# Patient Record
Sex: Female | Born: 1935
Health system: Southern US, Community
[De-identification: ages and names within clinical notes are randomized; demographics above are authoritative.]

## PROBLEM LIST (undated history)

## (undated) DIAGNOSIS — M810 Age-related osteoporosis without current pathological fracture: Secondary | ICD-10-CM

## (undated) DIAGNOSIS — F0390 Unspecified dementia without behavioral disturbance: Secondary | ICD-10-CM

## (undated) DIAGNOSIS — I10 Essential (primary) hypertension: Secondary | ICD-10-CM

## (undated) DIAGNOSIS — C801 Malignant (primary) neoplasm, unspecified: Secondary | ICD-10-CM

## (undated) DIAGNOSIS — I872 Venous insufficiency (chronic) (peripheral): Secondary | ICD-10-CM

---

## 2014-01-22 DIAGNOSIS — M79675 Pain in left toe(s): Secondary | ICD-10-CM | POA: Diagnosis not present

## 2014-01-22 DIAGNOSIS — M79674 Pain in right toe(s): Secondary | ICD-10-CM | POA: Diagnosis not present

## 2014-01-22 DIAGNOSIS — B351 Tinea unguium: Secondary | ICD-10-CM | POA: Diagnosis not present

## 2014-09-24 ENCOUNTER — Ambulatory Visit: Payer: Self-pay | Admitting: Podiatry

## 2015-02-18 DIAGNOSIS — Z23 Encounter for immunization: Secondary | ICD-10-CM | POA: Diagnosis not present

## 2015-09-06 DIAGNOSIS — N390 Urinary tract infection, site not specified: Secondary | ICD-10-CM | POA: Diagnosis not present

## 2015-12-08 DIAGNOSIS — L97509 Non-pressure chronic ulcer of other part of unspecified foot with unspecified severity: Secondary | ICD-10-CM | POA: Diagnosis not present

## 2015-12-08 DIAGNOSIS — L84 Corns and callosities: Secondary | ICD-10-CM | POA: Diagnosis not present

## 2015-12-08 DIAGNOSIS — B351 Tinea unguium: Secondary | ICD-10-CM | POA: Diagnosis not present

## 2016-01-20 DIAGNOSIS — Z23 Encounter for immunization: Secondary | ICD-10-CM | POA: Diagnosis not present

## 2016-07-03 DIAGNOSIS — D045 Carcinoma in situ of skin of trunk: Secondary | ICD-10-CM | POA: Diagnosis not present

## 2016-07-03 DIAGNOSIS — C50911 Malignant neoplasm of unspecified site of right female breast: Secondary | ICD-10-CM | POA: Diagnosis not present

## 2017-11-16 ENCOUNTER — Emergency Department (HOSPITAL_COMMUNITY): Payer: Medicare PPO

## 2017-11-16 ENCOUNTER — Other Ambulatory Visit: Payer: Self-pay

## 2017-11-16 ENCOUNTER — Emergency Department (HOSPITAL_COMMUNITY)
Admission: EM | Admit: 2017-11-16 | Discharge: 2017-11-16 | Disposition: A | Discharge status: Home / Self Care | Payer: Medicare PPO | Attending: Emergency Medicine | Admitting: Emergency Medicine

## 2017-11-16 ENCOUNTER — Encounter (HOSPITAL_COMMUNITY): Payer: Self-pay

## 2017-11-16 DIAGNOSIS — F0390 Unspecified dementia without behavioral disturbance: Secondary | ICD-10-CM | POA: Diagnosis not present

## 2017-11-16 DIAGNOSIS — S0990XA Unspecified injury of head, initial encounter: Secondary | ICD-10-CM | POA: Diagnosis not present

## 2017-11-16 DIAGNOSIS — I872 Venous insufficiency (chronic) (peripheral): Secondary | ICD-10-CM | POA: Diagnosis not present

## 2017-11-16 DIAGNOSIS — W19XXXA Unspecified fall, initial encounter: Secondary | ICD-10-CM | POA: Diagnosis not present

## 2017-11-16 DIAGNOSIS — M25521 Pain in right elbow: Secondary | ICD-10-CM | POA: Diagnosis not present

## 2017-11-16 DIAGNOSIS — M79604 Pain in right leg: Secondary | ICD-10-CM | POA: Insufficient documentation

## 2017-11-16 DIAGNOSIS — R410 Disorientation, unspecified: Secondary | ICD-10-CM | POA: Insufficient documentation

## 2017-11-16 DIAGNOSIS — R404 Transient alteration of awareness: Secondary | ICD-10-CM | POA: Diagnosis not present

## 2017-11-16 DIAGNOSIS — R51 Headache: Secondary | ICD-10-CM | POA: Diagnosis not present

## 2017-11-16 DIAGNOSIS — M542 Cervicalgia: Secondary | ICD-10-CM | POA: Diagnosis not present

## 2017-11-16 DIAGNOSIS — I10 Essential (primary) hypertension: Secondary | ICD-10-CM | POA: Insufficient documentation

## 2017-11-16 DIAGNOSIS — S199XXA Unspecified injury of neck, initial encounter: Secondary | ICD-10-CM | POA: Diagnosis not present

## 2017-11-16 HISTORY — DX: Venous insufficiency (chronic) (peripheral): I87.2

## 2017-11-16 HISTORY — DX: Age-related osteoporosis without current pathological fracture: M81.0

## 2017-11-16 HISTORY — DX: Essential (primary) hypertension: I10

## 2017-11-16 HISTORY — DX: Unspecified dementia, unspecified severity, without behavioral disturbance, psychotic disturbance, mood disturbance, and anxiety: F03.90

## 2017-11-16 HISTORY — DX: Malignant (primary) neoplasm, unspecified: C80.1

## 2017-11-16 NOTE — ED Provider Notes (Signed)
[facility] DEPT Provider Note   CSN: [id number] Arrival date & time: 11/16/17  1458     History   Chief Complaint Chief Complaint  Patient presents with  . Fall    HPI Nina Hall is a 84 y.o. female.  She is brought in by EMS from a fall [place] her [place] [facility] [place].  Reportedly patient was walking in the hallway tripped and fell onto her right side.  Per EMS she had some tenderness to her right elbow and right leg.  When I speak to the patient here in the emergency department she does not recall a fall and denies any complaints.  Level 5 caveat secondary to dementia.  The history is provided by the patient and the EMS personnel.  Fall  This is a new problem. The current episode started 1 to 2 hours ago. The problem has been resolved. Pertinent negatives include no chest pain, no abdominal pain, no headaches and no shortness of breath. Nothing aggravates the symptoms. Nothing relieves the symptoms. She has tried rest for the symptoms. The treatment provided moderate relief.    Past Medical History:  Diagnosis Date  . Cancer [facility])    malignant neoplasm of breast  . Dementia ([place])   . Hypertension   . Osteoporosis   . Peripheral venous insufficiency     There are no active problems to display for this patient.   History reviewed. No pertinent surgical history.   OB History   None      Home Medications    Prior to Admission medications   Not on File    Family History No family history on file.  Social History Social History   Tobacco Use  . Smoking status: Not on file  Substance Use Topics  . Alcohol use: Not on file  . Drug use: Not on file     Allergies   Patient has no known allergies.   Review of Systems Review of Systems  Constitutional: Negative for fever.  HENT: Negative for sore throat.   Eyes: Negative for visual disturbance.  Respiratory: Negative for shortness of breath.   Cardiovascular:  Negative for chest pain.  Gastrointestinal: Negative for abdominal pain.  Genitourinary: Negative for dysuria.  Musculoskeletal: Negative for neck pain.  Skin: Negative for rash.  Neurological: Negative for headaches.     Physical Exam Updated Vital Signs BP (!) 151/97 (BP Location: Right Arm)   Pulse 88   Temp 98 F (36.7 C) (Oral)   Resp 18   SpO2 95%   Physical Exam  Constitutional: She appears well-developed and well-nourished. No distress.  HENT:  Head: Normocephalic and atraumatic.  Eyes: Conjunctivae are normal.  Neck: Neck supple.  Cardiovascular: Normal rate, regular rhythm, normal heart sounds and intact distal pulses.  No murmur heard. Pulmonary/Chest: Effort normal and breath sounds normal. No respiratory distress.  Abdominal: Soft. There is no tenderness.  Musculoskeletal: Normal range of motion. She exhibits no edema, tenderness or deformity.  Neurological: She is alert. She has normal strength. She is disoriented (time and place). No cranial nerve deficit or sensory deficit. GCS eye subscore is 4. GCS verbal subscore is 5. GCS motor subscore is 6.  Skin: Skin is warm and dry.  Psychiatric: She has a normal mood and affect.  Nursing note and vitals reviewed.    ED Treatments / Results  Labs (all labs ordered are listed, but only abnormal results are displayed) Labs Reviewed - No data to display  EKG None  Radiology Ct Head Wo Contrast  Result Date: 11/16/2017 CLINICAL DATA:  Headache and neck pain post fall, history breast cancer, dementia, hypertension EXAM: CT HEAD WITHOUT CONTRAST CT CERVICAL SPINE WITHOUT CONTRAST TECHNIQUE: Multidetector CT imaging of the head and cervical spine was performed following the standard protocol without intravenous contrast. Multiplanar CT image reconstructions of the cervical spine were also generated. COMPARISON:  None FINDINGS: CT HEAD FINDINGS Brain: Generalized atrophy. Normal ventricular morphology. No midline shift  or mass effect. Small vessel chronic ischemic changes of deep cerebral white matter. Old superior LEFT cerebellar infarct. No intracranial hemorrhage, mass lesion, evidence of acute infarction, or extra-axial fluid collection. Vascular: No hyperdense vessels. Skull: Bones appear demineralized but intact. Sinuses/Orbits: Minimal mucosal thickening in ethmoid air cells. Remaining visualized paranasal sinuses and mastoid air cells clear. Intraorbital tissue planes clear. Other: N/A CT CERVICAL SPINE FINDINGS Alignment: Normal Skull base and vertebrae: Diffuse osseous demineralization. Multilevel facet degenerative changes. Partial fusion of C3-C4. Visualized skull base intact. Cervical vertebra normal in height. No fracture, subluxation, or bone destruction. Soft tissues and spinal canal: Prevertebral soft tissues normal thickness. Cervical soft tissues otherwise unremarkable. Disc levels:  No additional abnormalities Upper chest: Lung apices clear Other: N/A IMPRESSION: Atrophy with small vessel chronic ischemic changes of deep cerebral white matter. Old LEFT cerebellar infarct. No acute intracranial abnormalities. Osseous demineralization with degenerative disc and facet disease changes of the cervical spine. No acute cervical spine abnormalities. Electronically Signed   By: Lavonia Dana M.D.   On: 11/16/2017 16:37   Ct Cervical Spine Wo Contrast  Result Date: 11/16/2017 CLINICAL DATA:  Headache and neck pain post fall, history breast cancer, dementia, hypertension EXAM: CT HEAD WITHOUT CONTRAST CT CERVICAL SPINE WITHOUT CONTRAST TECHNIQUE: Multidetector CT imaging of the head and cervical spine was performed following the standard protocol without intravenous contrast. Multiplanar CT image reconstructions of the cervical spine were also generated. COMPARISON:  None FINDINGS: CT HEAD FINDINGS Brain: Generalized atrophy. Normal ventricular morphology. No midline shift or mass effect. Small vessel chronic ischemic  changes of deep cerebral white matter. Old superior LEFT cerebellar infarct. No intracranial hemorrhage, mass lesion, evidence of acute infarction, or extra-axial fluid collection. Vascular: No hyperdense vessels. Skull: Bones appear demineralized but intact. Sinuses/Orbits: Minimal mucosal thickening in ethmoid air cells. Remaining visualized paranasal sinuses and mastoid air cells clear. Intraorbital tissue planes clear. Other: N/A CT CERVICAL SPINE FINDINGS Alignment: Normal Skull base and vertebrae: Diffuse osseous demineralization. Multilevel facet degenerative changes. Partial fusion of C3-C4. Visualized skull base intact. Cervical vertebra normal in height. No fracture, subluxation, or bone destruction. Soft tissues and spinal canal: Prevertebral soft tissues normal thickness. Cervical soft tissues otherwise unremarkable. Disc levels:  No additional abnormalities Upper chest: Lung apices clear Other: N/A IMPRESSION: Atrophy with small vessel chronic ischemic changes of deep cerebral white matter. Old LEFT cerebellar infarct. No acute intracranial abnormalities. Osseous demineralization with degenerative disc and facet disease changes of the cervical spine. No acute cervical spine abnormalities. Electronically Signed   By: Lavonia Dana M.D.   On: 11/16/2017 16:37    Procedures Procedures (including critical care time)  Medications Ordered in ED Medications - No data to display   Initial Impression / Assessment and Plan / ED Course  I have reviewed the triage vital signs and the nursing notes.  Pertinent labs & imaging results that were available during my care of the patient were reviewed by me and considered in my medical decision making (see chart for details).  Clinical Course as of Nov 17 2322  Fri Nov 17, 7370  3856 84 year old female not on anticoagulation here after what sounds like a mechanical fall at her living facility.  She denies any complaints but also does not recall the fall.   She has no obvious findings on physical exam.  I have ordered her to get a head CT and C-spine CT.  Awaiting family who are supposedly on the way here.   [MB]  9767 Patient's power of attorney her niece is here now reviewed the physical findings and results of her CAT scan imaging with her and the patient.  We will try to get the patient up and make sure that she is able to ambulate safely.   [MB]  1806 The patient has been up and ambulated here with assistance.  Her niece is comfortable with her going back home.   [MB]    Clinical Course User Index [MB] Hayden Rasmussen, MD    Final Clinical Impressions(s) / ED Diagnoses   Final diagnoses:  Fall, initial encounter    ED Discharge Orders    None       Hayden Rasmussen, MD 11/16/17 2326

## 2017-11-16 NOTE — ED Notes (Signed)
Discharge instructions reviewed with patient. Patient verbalizes understanding. VSS.   

## 2017-11-16 NOTE — Discharge Instructions (Addendum)
You were evaluated in the emergency department for a fall.  We did not find any obvious signs of injury.  You had a CAT scan of your head and cervical spine that was unremarkable.  Please follow-up with your doctor and return if any concerns.

## 2017-11-16 NOTE — ED Triage Notes (Addendum)
Patient BIB EMS from [facility]. Patient was walking down the hallway, tripped and fell on right side. Patient reports tenderness to right elbow and right leg. No deformities. No bloodthinners. VSS. Hx of dementia. Per EMS report, CNA on scene would call patient's family to inform them of the fall.  Patient is DNR- golden ticket at bedside. BP 138/70, HR 90, RR16, CBG 154

## 2017-11-16 NOTE — ED Notes (Signed)
Patient ambulated to the door and back in the room with minimal assist. Patient denies pain.

## 2017-11-16 NOTE — ED Notes (Signed)
Bed: EX93 Expected date:  Expected time:  Means of arrival:  Comments: EMS- 84 yo female, fall

## 2018-02-15 DIAGNOSIS — Q845 Enlarged and hypertrophic nails: Secondary | ICD-10-CM | POA: Diagnosis not present

## 2018-02-15 DIAGNOSIS — I739 Peripheral vascular disease, unspecified: Secondary | ICD-10-CM | POA: Diagnosis not present

## 2018-02-15 DIAGNOSIS — B351 Tinea unguium: Secondary | ICD-10-CM | POA: Diagnosis not present

## 2018-02-15 DIAGNOSIS — L603 Nail dystrophy: Secondary | ICD-10-CM | POA: Diagnosis not present

## 2018-05-30 DIAGNOSIS — R2689 Other abnormalities of gait and mobility: Secondary | ICD-10-CM | POA: Diagnosis not present

## 2018-05-30 DIAGNOSIS — M6281 Muscle weakness (generalized): Secondary | ICD-10-CM | POA: Diagnosis not present

## 2018-06-03 DIAGNOSIS — M6281 Muscle weakness (generalized): Secondary | ICD-10-CM | POA: Diagnosis not present

## 2018-06-03 DIAGNOSIS — R2689 Other abnormalities of gait and mobility: Secondary | ICD-10-CM | POA: Diagnosis not present

## 2018-06-06 DIAGNOSIS — Z20828 Contact with and (suspected) exposure to other viral communicable diseases: Secondary | ICD-10-CM | POA: Diagnosis not present

## 2018-06-11 DIAGNOSIS — Z20828 Contact with and (suspected) exposure to other viral communicable diseases: Secondary | ICD-10-CM | POA: Diagnosis not present

## 2018-07-11 DIAGNOSIS — B351 Tinea unguium: Secondary | ICD-10-CM | POA: Diagnosis not present

## 2018-07-11 DIAGNOSIS — I739 Peripheral vascular disease, unspecified: Secondary | ICD-10-CM | POA: Diagnosis not present

## 2018-07-11 DIAGNOSIS — L603 Nail dystrophy: Secondary | ICD-10-CM | POA: Diagnosis not present

## 2018-07-11 DIAGNOSIS — Q845 Enlarged and hypertrophic nails: Secondary | ICD-10-CM | POA: Diagnosis not present

## 2018-08-26 DIAGNOSIS — R2689 Other abnormalities of gait and mobility: Secondary | ICD-10-CM | POA: Diagnosis not present

## 2018-08-26 DIAGNOSIS — M6281 Muscle weakness (generalized): Secondary | ICD-10-CM | POA: Diagnosis not present

## 2018-08-29 DIAGNOSIS — R2689 Other abnormalities of gait and mobility: Secondary | ICD-10-CM | POA: Diagnosis not present

## 2018-08-29 DIAGNOSIS — M6281 Muscle weakness (generalized): Secondary | ICD-10-CM | POA: Diagnosis not present

## 2018-08-30 DIAGNOSIS — M6281 Muscle weakness (generalized): Secondary | ICD-10-CM | POA: Diagnosis not present

## 2018-08-30 DIAGNOSIS — R2689 Other abnormalities of gait and mobility: Secondary | ICD-10-CM | POA: Diagnosis not present

## 2018-09-06 DIAGNOSIS — M6281 Muscle weakness (generalized): Secondary | ICD-10-CM | POA: Diagnosis not present

## 2018-09-06 DIAGNOSIS — R2689 Other abnormalities of gait and mobility: Secondary | ICD-10-CM | POA: Diagnosis not present

## 2018-09-09 DIAGNOSIS — M6281 Muscle weakness (generalized): Secondary | ICD-10-CM | POA: Diagnosis not present

## 2018-09-09 DIAGNOSIS — R2689 Other abnormalities of gait and mobility: Secondary | ICD-10-CM | POA: Diagnosis not present

## 2018-09-11 DIAGNOSIS — M6281 Muscle weakness (generalized): Secondary | ICD-10-CM | POA: Diagnosis not present

## 2018-09-11 DIAGNOSIS — R488 Other symbolic dysfunctions: Secondary | ICD-10-CM | POA: Diagnosis not present

## 2018-09-12 DIAGNOSIS — M6281 Muscle weakness (generalized): Secondary | ICD-10-CM | POA: Diagnosis not present

## 2018-09-12 DIAGNOSIS — R488 Other symbolic dysfunctions: Secondary | ICD-10-CM | POA: Diagnosis not present

## 2018-09-13 DIAGNOSIS — M6281 Muscle weakness (generalized): Secondary | ICD-10-CM | POA: Diagnosis not present

## 2018-09-13 DIAGNOSIS — R488 Other symbolic dysfunctions: Secondary | ICD-10-CM | POA: Diagnosis not present

## 2018-09-16 DIAGNOSIS — M6281 Muscle weakness (generalized): Secondary | ICD-10-CM | POA: Diagnosis not present

## 2018-09-16 DIAGNOSIS — R2689 Other abnormalities of gait and mobility: Secondary | ICD-10-CM | POA: Diagnosis not present

## 2018-09-17 DIAGNOSIS — R488 Other symbolic dysfunctions: Secondary | ICD-10-CM | POA: Diagnosis not present

## 2018-09-17 DIAGNOSIS — M6281 Muscle weakness (generalized): Secondary | ICD-10-CM | POA: Diagnosis not present

## 2018-09-18 DIAGNOSIS — R488 Other symbolic dysfunctions: Secondary | ICD-10-CM | POA: Diagnosis not present

## 2018-09-18 DIAGNOSIS — M6281 Muscle weakness (generalized): Secondary | ICD-10-CM | POA: Diagnosis not present

## 2018-09-23 DIAGNOSIS — R488 Other symbolic dysfunctions: Secondary | ICD-10-CM | POA: Diagnosis not present

## 2018-09-23 DIAGNOSIS — M6281 Muscle weakness (generalized): Secondary | ICD-10-CM | POA: Diagnosis not present

## 2018-09-25 DIAGNOSIS — R488 Other symbolic dysfunctions: Secondary | ICD-10-CM | POA: Diagnosis not present

## 2018-09-25 DIAGNOSIS — M6281 Muscle weakness (generalized): Secondary | ICD-10-CM | POA: Diagnosis not present

## 2018-09-27 DIAGNOSIS — M6281 Muscle weakness (generalized): Secondary | ICD-10-CM | POA: Diagnosis not present

## 2018-09-27 DIAGNOSIS — R488 Other symbolic dysfunctions: Secondary | ICD-10-CM | POA: Diagnosis not present

## 2018-09-30 DIAGNOSIS — R488 Other symbolic dysfunctions: Secondary | ICD-10-CM | POA: Diagnosis not present

## 2018-09-30 DIAGNOSIS — M6281 Muscle weakness (generalized): Secondary | ICD-10-CM | POA: Diagnosis not present

## 2018-10-01 DIAGNOSIS — R488 Other symbolic dysfunctions: Secondary | ICD-10-CM | POA: Diagnosis not present

## 2018-10-01 DIAGNOSIS — M6281 Muscle weakness (generalized): Secondary | ICD-10-CM | POA: Diagnosis not present

## 2018-10-03 DIAGNOSIS — M6281 Muscle weakness (generalized): Secondary | ICD-10-CM | POA: Diagnosis not present

## 2018-10-03 DIAGNOSIS — R488 Other symbolic dysfunctions: Secondary | ICD-10-CM | POA: Diagnosis not present

## 2018-10-04 DIAGNOSIS — M6281 Muscle weakness (generalized): Secondary | ICD-10-CM | POA: Diagnosis not present

## 2018-10-04 DIAGNOSIS — R488 Other symbolic dysfunctions: Secondary | ICD-10-CM | POA: Diagnosis not present

## 2018-10-07 DIAGNOSIS — M6281 Muscle weakness (generalized): Secondary | ICD-10-CM | POA: Diagnosis not present

## 2018-10-07 DIAGNOSIS — R488 Other symbolic dysfunctions: Secondary | ICD-10-CM | POA: Diagnosis not present

## 2018-10-09 DIAGNOSIS — R488 Other symbolic dysfunctions: Secondary | ICD-10-CM | POA: Diagnosis not present

## 2018-10-09 DIAGNOSIS — M6281 Muscle weakness (generalized): Secondary | ICD-10-CM | POA: Diagnosis not present

## 2018-10-11 DIAGNOSIS — M6281 Muscle weakness (generalized): Secondary | ICD-10-CM | POA: Diagnosis not present

## 2018-10-11 DIAGNOSIS — R488 Other symbolic dysfunctions: Secondary | ICD-10-CM | POA: Diagnosis not present

## 2018-10-14 DIAGNOSIS — M6281 Muscle weakness (generalized): Secondary | ICD-10-CM | POA: Diagnosis not present

## 2018-10-14 DIAGNOSIS — R488 Other symbolic dysfunctions: Secondary | ICD-10-CM | POA: Diagnosis not present

## 2018-10-17 DIAGNOSIS — R488 Other symbolic dysfunctions: Secondary | ICD-10-CM | POA: Diagnosis not present

## 2018-10-17 DIAGNOSIS — M6281 Muscle weakness (generalized): Secondary | ICD-10-CM | POA: Diagnosis not present

## 2018-10-18 DIAGNOSIS — R488 Other symbolic dysfunctions: Secondary | ICD-10-CM | POA: Diagnosis not present

## 2018-10-18 DIAGNOSIS — M6281 Muscle weakness (generalized): Secondary | ICD-10-CM | POA: Diagnosis not present

## 2018-10-21 DIAGNOSIS — R488 Other symbolic dysfunctions: Secondary | ICD-10-CM | POA: Diagnosis not present

## 2018-10-21 DIAGNOSIS — M6281 Muscle weakness (generalized): Secondary | ICD-10-CM | POA: Diagnosis not present

## 2018-10-23 DIAGNOSIS — M6281 Muscle weakness (generalized): Secondary | ICD-10-CM | POA: Diagnosis not present

## 2018-10-23 DIAGNOSIS — R488 Other symbolic dysfunctions: Secondary | ICD-10-CM | POA: Diagnosis not present

## 2018-10-24 DIAGNOSIS — M6281 Muscle weakness (generalized): Secondary | ICD-10-CM | POA: Diagnosis not present

## 2018-10-24 DIAGNOSIS — R488 Other symbolic dysfunctions: Secondary | ICD-10-CM | POA: Diagnosis not present

## 2018-10-28 DIAGNOSIS — R488 Other symbolic dysfunctions: Secondary | ICD-10-CM | POA: Diagnosis not present

## 2018-10-28 DIAGNOSIS — M6281 Muscle weakness (generalized): Secondary | ICD-10-CM | POA: Diagnosis not present

## 2018-10-30 DIAGNOSIS — R488 Other symbolic dysfunctions: Secondary | ICD-10-CM | POA: Diagnosis not present

## 2018-10-30 DIAGNOSIS — M6281 Muscle weakness (generalized): Secondary | ICD-10-CM | POA: Diagnosis not present

## 2019-04-17 DEATH — deceased

## 2019-09-14 IMAGING — CT CT CERVICAL SPINE W/O CM
3 of 7 series · 10 of 33 positions shown, 11 images · non-contrast
Comparison: None

CLINICAL DATA: Headache and neck pain post fall, history breast
cancer, dementia, hypertension

EXAM:
CT HEAD WITHOUT CONTRAST
CT CERVICAL SPINE WITHOUT CONTRAST
TECHNIQUE: Multidetector CT imaging of the head and cervical spine was
performed following the standard protocol without intravenous
contrast. Multiplanar CT image reconstructions of the cervical spine
were also generated.

[Series 11: orthogonal bone · coronal · 0.22mm/px · 1 of 95 slices shown]
[im 48/95  bone]
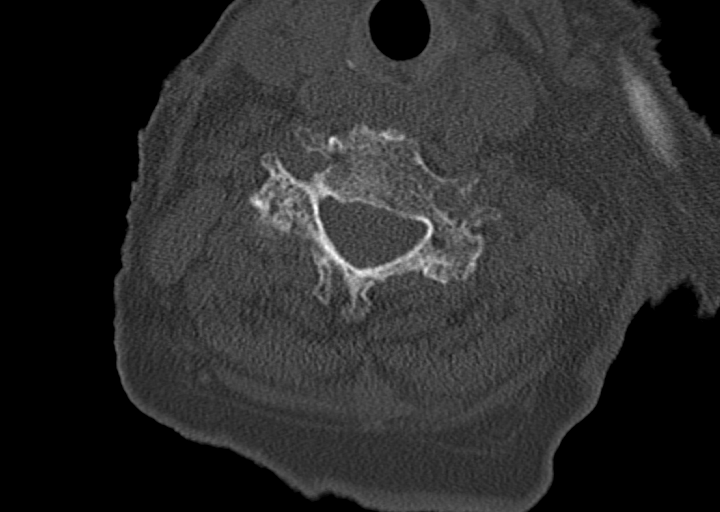

[Series 12: coronal bone · axial · 0.29mm/px · z∈[+1552,+1615]mm · 4 of 61 slices shown, 5 images]
[im 13/61  soft-tissue]
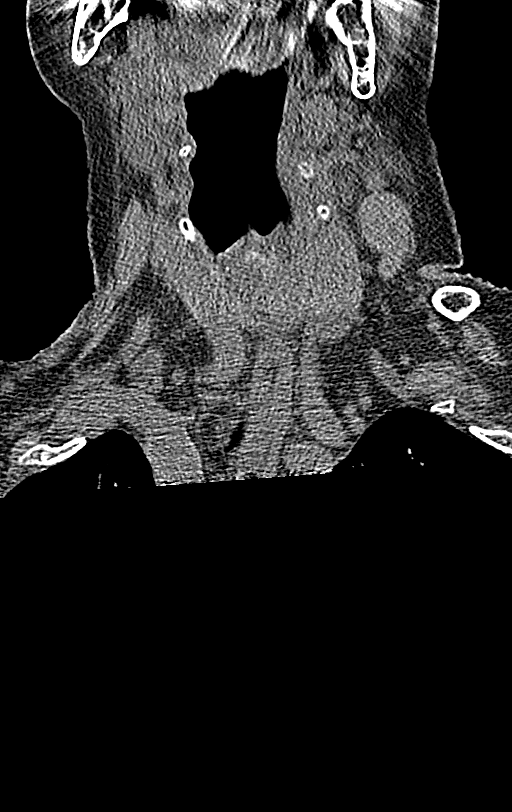
[im 13/61  bone]
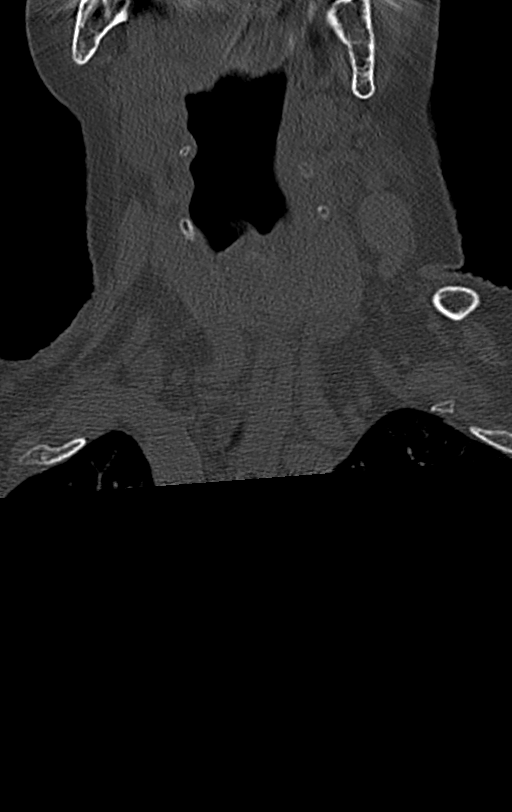
[im 25/61  bone]
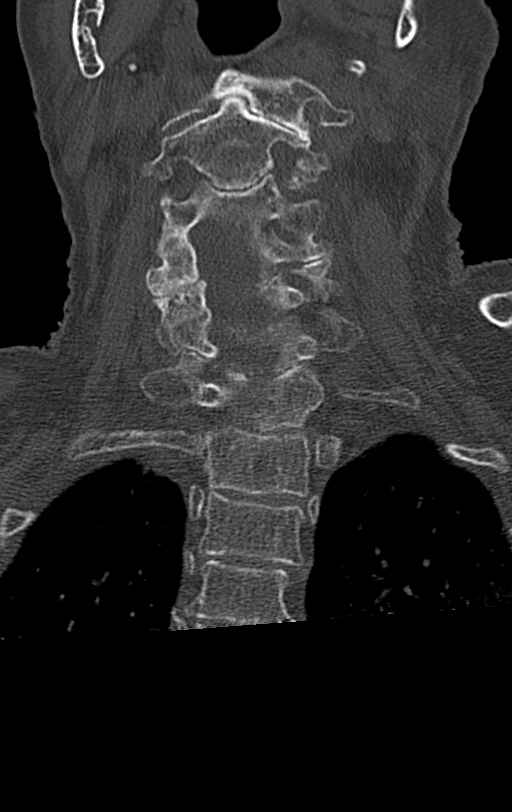
[im 37/61  bone]
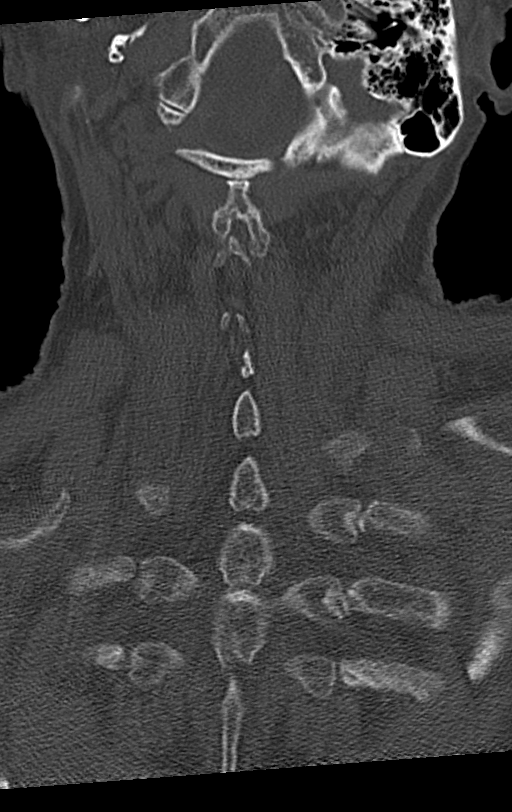
[im 49/61  bone]
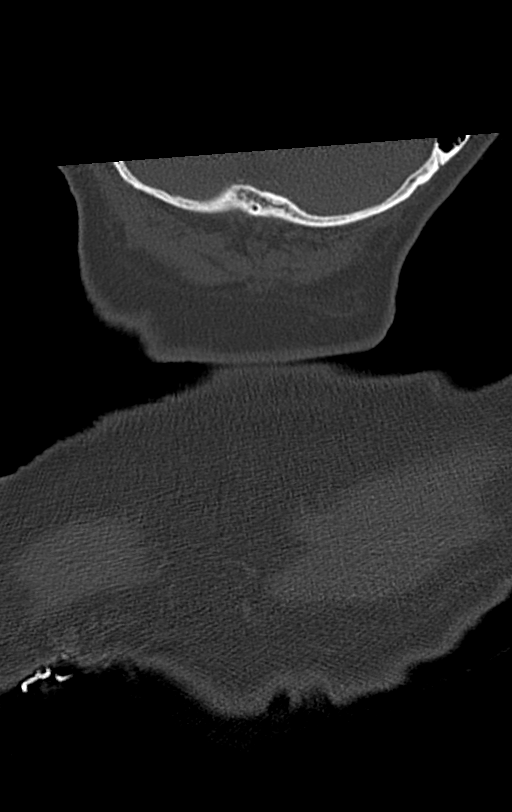

[Series 13: sagittal bone · sagittal · 0.23mm/px · 5 of 61 slices shown]
[im 11/61  bone]
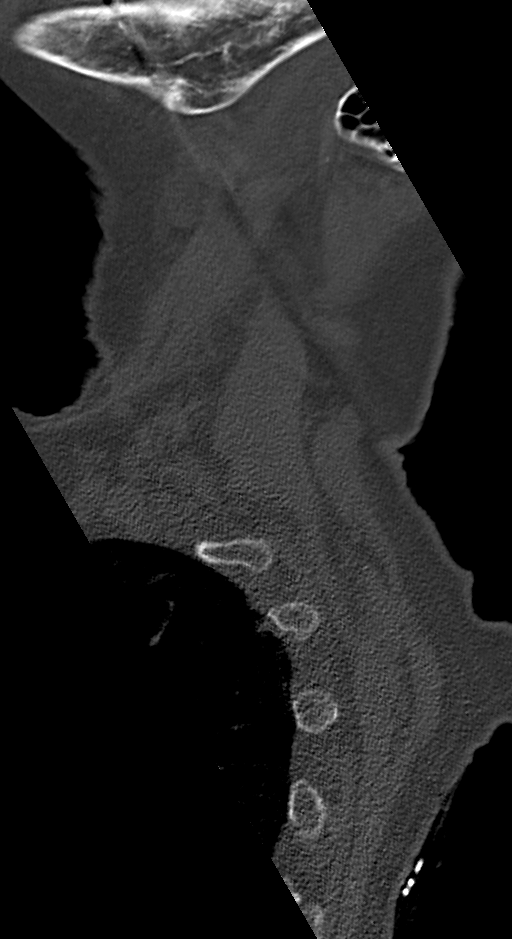
[im 21/61  bone]
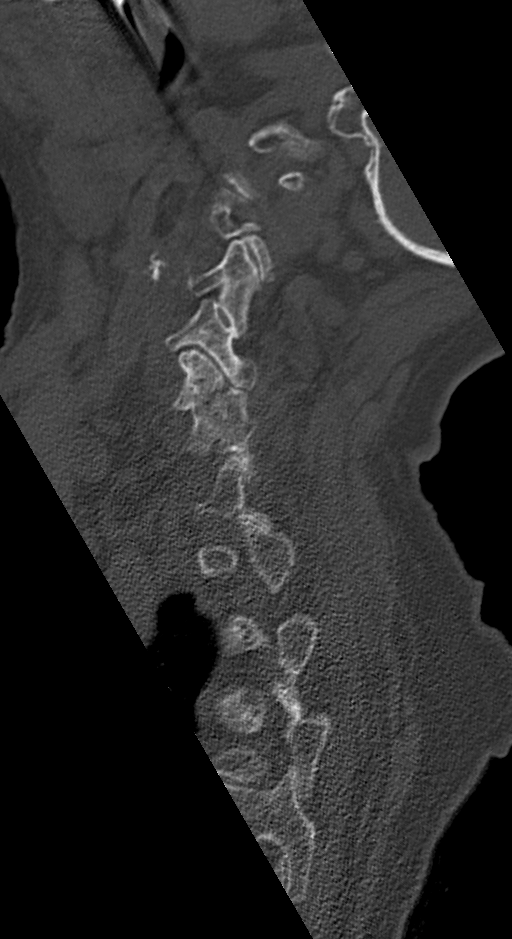
[im 31/61  bone]
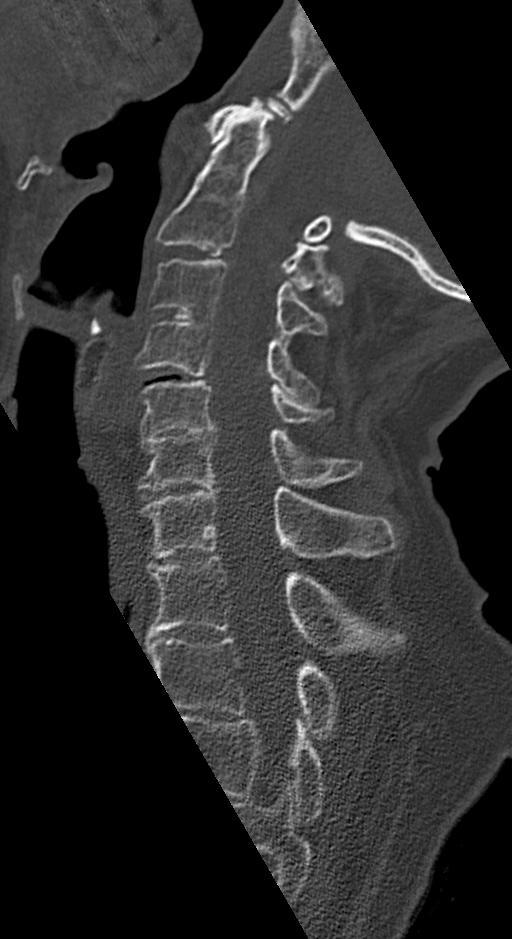
[im 41/61  bone]
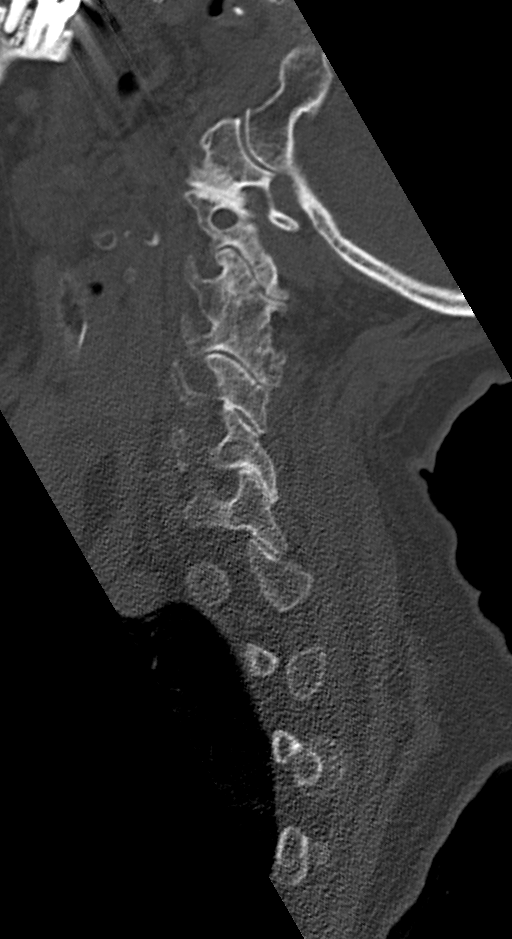
[im 51/61  bone]
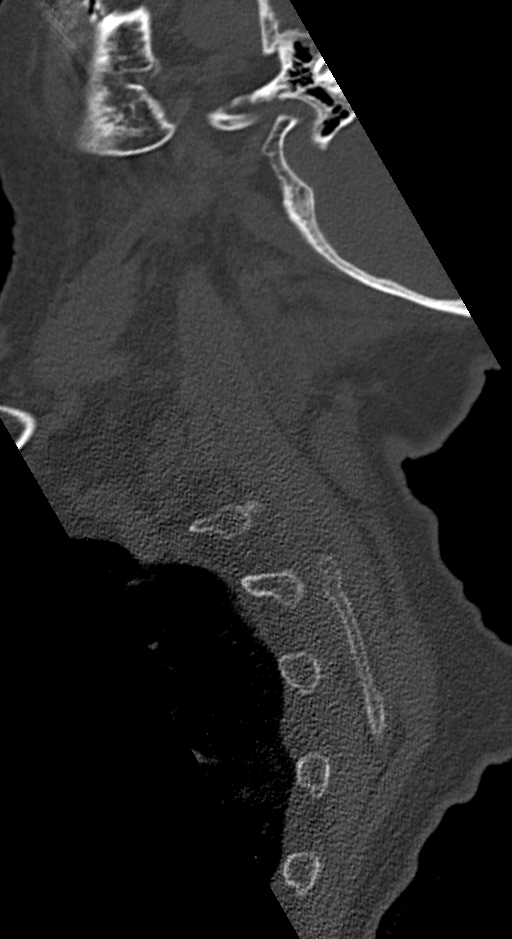

[10 of 33 positions shown; findings below may reference images not displayed]

FINDINGS: CT HEAD FINDINGS

Brain: Generalized atrophy. Normal ventricular morphology. No
midline shift or mass effect. Small vessel chronic ischemic changes
of deep cerebral white matter. Old superior LEFT cerebellar infarct.
No intracranial hemorrhage, mass lesion, evidence of acute
infarction, or extra-axial fluid collection.

Vascular: No hyperdense vessels.

Skull: Bones appear demineralized but intact.

Sinuses/Orbits: Minimal mucosal thickening in ethmoid air cells.
Remaining visualized paranasal sinuses and mastoid air cells clear.
Intraorbital tissue planes clear.

Other: N/A

CT CERVICAL SPINE FINDINGS

Alignment: Normal

Skull base and vertebrae: Diffuse osseous demineralization.
Multilevel facet degenerative changes. Partial fusion of C3-C4.
Visualized skull base intact. Cervical vertebra normal in height. No
fracture, subluxation, or bone destruction.

Soft tissues and spinal canal: Prevertebral soft tissues normal
thickness. Cervical soft tissues otherwise unremarkable.

Disc levels:  No additional abnormalities

Upper chest: Lung apices clear

Other: N/A
IMPRESSION: Atrophy with small vessel chronic ischemic changes of deep cerebral
white matter.

Old LEFT cerebellar infarct.

No acute intracranial abnormalities.

Osseous demineralization with degenerative disc and facet disease
changes of the cervical spine.

No acute cervical spine abnormalities.
